# Patient Record
Sex: Female | Born: 1965
Health system: Southern US, Community
[De-identification: ages and names within clinical notes are randomized; demographics above are authoritative.]

## PROBLEM LIST (undated history)

## (undated) ENCOUNTER — Ambulatory Visit (HOSPITAL_BASED_OUTPATIENT_CLINIC_OR_DEPARTMENT_OTHER): Admission: EM | Payer: Self-pay | Source: Home / Self Care

## (undated) DIAGNOSIS — F419 Anxiety disorder, unspecified: Secondary | ICD-10-CM

## (undated) DIAGNOSIS — G43909 Migraine, unspecified, not intractable, without status migrainosus: Secondary | ICD-10-CM

## (undated) HISTORY — PX: CHOLECYSTECTOMY: SHX55

## (undated) HISTORY — DX: Anxiety disorder, unspecified: F41.9

## (undated) HISTORY — DX: Migraine, unspecified, not intractable, without status migrainosus: G43.909

---

## 2001-05-14 ENCOUNTER — Emergency Department (HOSPITAL_COMMUNITY): Admission: EM | Admit: 2001-05-14 | Discharge: 2001-05-14 | Payer: Self-pay | Admitting: Emergency Medicine

## 2011-12-25 ENCOUNTER — Encounter (HOSPITAL_BASED_OUTPATIENT_CLINIC_OR_DEPARTMENT_OTHER): Payer: Self-pay | Admitting: *Deleted

## 2011-12-25 ENCOUNTER — Emergency Department (HOSPITAL_BASED_OUTPATIENT_CLINIC_OR_DEPARTMENT_OTHER)
Admission: EM | Admit: 2011-12-25 | Discharge: 2011-12-25 | Disposition: A | Discharge status: Home / Self Care | Payer: 59 | Attending: Emergency Medicine | Admitting: Emergency Medicine

## 2011-12-25 DIAGNOSIS — B373 Candidiasis of vulva and vagina: Secondary | ICD-10-CM

## 2011-12-25 DIAGNOSIS — J069 Acute upper respiratory infection, unspecified: Secondary | ICD-10-CM | POA: Insufficient documentation

## 2011-12-25 DIAGNOSIS — J3489 Other specified disorders of nose and nasal sinuses: Secondary | ICD-10-CM | POA: Insufficient documentation

## 2011-12-25 DIAGNOSIS — B3731 Acute candidiasis of vulva and vagina: Secondary | ICD-10-CM | POA: Insufficient documentation

## 2011-12-25 MED ORDER — OXYMETAZOLINE HCL 0.05 % NA SOLN
1.0000 | Freq: Once | NASAL | Status: AC
Start: 2011-12-25 — End: 2011-12-25
  Administered 2011-12-25: 1 via NASAL
  Filled 2011-12-25: qty 15

## 2011-12-25 MED ORDER — FLUCONAZOLE 50 MG PO TABS
150.0000 mg | ORAL_TABLET | Freq: Once | ORAL | Status: AC
  Administered 2011-12-25: 150 mg via ORAL
  Filled 2011-12-25: qty 1

## 2011-12-25 NOTE — ED Provider Notes (Addendum)
History     CSN: [id number]  Arrival date & time 12/25/11  8295   First MD Initiated Contact with Patient 12/25/11 [phone]      Chief Complaint  Patient presents with  . Nasal Congestion   Patient presents with multiple complaints. She's been having nasal congestion for 4 weeks. She's apparently been prescribed numerous different antibiotics and states that not getting better. Also, states for years feels stuffy. She's been having a yeast infection from taking the antibiotics. She also feels some redness to the skin beneath her breasts. Patient also complains of constipation. She denies fevers, denies chest pain or difficulty breathing. She has had no sore throat, no dizziness. No malaise or syncope. (Consider location/radiation/quality/duration/timing/severity/associated sxs/prior treatment) HPI  History reviewed. No pertinent past medical history.  History reviewed. No pertinent past surgical history.  History reviewed. No pertinent family history.  History  Substance Use Topics  . Smoking status: Never Smoker   . Smokeless tobacco: Not on file  . Alcohol Use: No    OB History    Grav Para Term Preterm Abortions TAB SAB Ect Mult Living                  Review of Systems  All other systems reviewed and are negative.    Allergies  Erythromycin  Home Medications   Current Outpatient Rx  Name Route Sig Dispense Refill  . MOXIFLOXACIN HCL 400 MG PO TABS Oral Take 400 mg by mouth daily.      BP 133/65  Pulse 78  Temp(Src) 97.8 F (36.6 C) (Oral)  Resp 18  Ht 5\' 2"  (1.575 m)  Wt 150 lb (68.04 kg)  BMI 27.44 kg/m2  SpO2 99%  LMP 12/07/2011  Physical Exam  Nursing note and vitals reviewed. Constitutional: She appears well-developed and well-nourished. No distress.  HENT:  Head: Normocephalic.  Mouth/Throat: No oropharyngeal exudate.       Minimal nasal congestion, and rhinorrhea. Otherwise, normal  Eyes: Pupils are equal, round, and reactive to light.    Cardiovascular: Normal heart sounds.   Pulmonary/Chest: Breath sounds normal.  Abdominal: Soft.  Musculoskeletal: Normal range of motion.  Neurological: She is alert.  Skin: Skin is warm and dry.    ED Course  Procedures (including critical care time)  Labs Reviewed - No data to display No results found.   1. Viral URI   2. Vaginal yeast infection       MDM  Patient is seen and examined, initial history and physical is completed. Evaluation initiated      Patient will be given Afrin for nasal congestion. She was counseled regarding the importance of proper treatment of viral infections. She was given one dose of oral Diflucan and will be stable for discharge  Evony Rezek A. Patrica Duel, MD 12/25/11 0559  Lorelle Gibbs. Patrica Duel, MD 01/08/12 1103

## 2011-12-25 NOTE — ED Notes (Signed)
C/o nasal congestion x 4 weeks even after numerous antibiotics. Also c/o right ear pain

## 2011-12-25 NOTE — ED Notes (Signed)
MD at bedside. 

## 2014-03-30 ENCOUNTER — Encounter: Payer: Self-pay | Admitting: Internal Medicine

## 2014-03-30 ENCOUNTER — Ambulatory Visit (INDEPENDENT_AMBULATORY_CARE_PROVIDER_SITE_OTHER): Payer: 59 | Admitting: Internal Medicine

## 2014-03-30 VITALS — BP 118/66 | HR 97 | Temp 98.4°F | Resp 12 | Ht 62.0 in | Wt 139.0 lb

## 2014-03-30 DIAGNOSIS — E063 Autoimmune thyroiditis: Secondary | ICD-10-CM

## 2014-03-30 DIAGNOSIS — E039 Hypothyroidism, unspecified: Secondary | ICD-10-CM

## 2014-03-30 NOTE — Patient Instructions (Signed)
Please return in 6 months. Please stop at the lab.  Hypothyroidism The thyroid is a large gland located in the lower front of your neck. The thyroid gland helps control metabolism. Metabolism is how your body handles food. It controls metabolism with the hormone thyroxine. When this gland is underactive (hypothyroid), it produces too little hormone.  CAUSES These include:   Absence or destruction of thyroid tissue.  Goiter due to iodine deficiency.  Goiter due to medications.  Congenital defects (since birth).  Problems with the pituitary. This causes a lack of TSH (thyroid stimulating hormone). This hormone tells the thyroid to turn out more hormone. SYMPTOMS  Lethargy (feeling as though you have no energy)  Cold intolerance  Weight gain (in spite of normal food intake)  Dry skin  Coarse hair  Menstrual irregularity (if severe, may lead to infertility)  Slowing of thought processes Cardiac problems are also caused by insufficient amounts of thyroid hormone. Hypothyroidism in the newborn is cretinism, and is an extreme form. It is important that this form be treated adequately and immediately or it will lead rapidly to retarded physical and mental development. DIAGNOSIS  To prove hypothyroidism, your caregiver may do blood tests and ultrasound tests. Sometimes the signs are hidden. It may be necessary for your caregiver to watch this illness with blood tests either before or after diagnosis and treatment. TREATMENT  Low levels of thyroid hormone are increased by using synthetic thyroid hormone. This is a safe, effective treatment. It usually takes about four weeks to gain the full effects of the medication. After you have the full effect of the medication, it will generally take another four weeks for problems to leave. Your caregiver may start you on low doses. If you have had heart problems the dose may be gradually increased. It is generally not an emergency to get rapidly to  normal. HOME CARE INSTRUCTIONS   Take your medications as your caregiver suggests. Let your caregiver know of any medications you are taking or start taking. Your caregiver will help you with dosage schedules.  As your condition improves, your dosage needs may increase. It will be necessary to have continuing blood tests as suggested by your caregiver.  Report all suspected medication side effects to your caregiver. SEEK MEDICAL CARE IF: Seek medical care if you develop:  Sweating.  Tremulousness (tremors).  Anxiety.  Rapid weight loss.  Heat intolerance.  Emotional swings.  Diarrhea.  Weakness. SEEK IMMEDIATE MEDICAL CARE IF:  You develop chest pain, an irregular heart beat (palpitations), or a rapid heart beat. MAKE SURE YOU:   Understand these instructions.  Will watch your condition.  Will get help right away if you are not doing well or get worse. Document Released: 11/24/2005 Document Revised: 02/16/2012 Document Reviewed: 07/14/2008 [facility] Patient Information 2014 [place], [place].

## 2014-03-30 NOTE — Progress Notes (Signed)
Patient ID: Sheri Simpson, female   DOB: June 24, 1966, 48 y.o.   MRN: [id number]   HPI  Sheri Simpson is a 48 y.o.-year-old female, self-referred for evaluation for hypothyroidism.  Pt. has been dx with hypothyroidism in 2012 is on Levothyroxine >> switched to Synthroid brand name 112 mcg >> gained weight 20 lbs over 2 years >> stopped for 7 months >> lost 15 lbs >> TSH was checked and was high, but she still did not start the LT4:  She was taking the Synthroid: - fasting - with water - separated by >30 min from b'fast  - along with Lexapro - no calcium, iron, PPIs, multivitamins   I reviewed pt's thyroid tests: Summer 2014: TSH 13 (off Synthroid for 7 months) - per pt's report. During the appt, we received labs from PCP office: 03/2014: TSH 5.539 (0.35-4.5); TT4 5.2  She was taking Lexapro >> now on Wellbutrin.  Pt denies feeling nodules in neck, hoarseness, dysphagia/odynophagia, SOB with lying down.  Pt describes: - + cold intolerance - + weight loss (see above) - fatigue - + constipation (always) - + dry skin on L foot (psoriasis) - no hair falling - no depression, + anxiety  She has + FH of thyroid disorders in: PGM. No FH of thyroid cancer.  No h/o radiation tx to head or neck.  No recent use of iodine supplements. No seaweed or kelp.  I reviewed her chart and she also has a history of anxiety, cholecystectomy 2013.  ROS: Constitutional: both weight gain/loss, no fatigue, no subjective hyperthermia/hypothermia Eyes: no blurry vision, no xerophthalmia ENT: no sore throat, no nodules palpated in throat, no dysphagia/odynophagia, no hoarseness Cardiovascular: no CP/SOB/palpitations/leg swelling Respiratory: no cough/SOB Gastrointestinal: + N/noV/D/+C Musculoskeletal: no muscle/joint aches Skin: + rash - L dorsum foot (psoriasis) Neurological: no tremors/numbness/tingling/dizziness Psychiatric: no depression/+ anxiety  Past Medical History  Diagnosis Date  .  Migraines   . Anxiety    Past Surgical History  Procedure Laterality Date  . Cholecystectomy    . Cesarean section     History   Social History  . Marital Status: Widowed    Spouse Name: N/A    Number of Children: 2   Occupational History  . Courier for [employer]   Social History Main Topics  . Smoking status: Never Smoker   . Smokeless tobacco: Not on file  . Alcohol Use: 2x a month,1 drink, wine  . Drug Use: No   Current Outpatient Prescriptions on File Prior to Visit  Medication Sig Dispense Refill  . moxifloxacin (AVELOX) 400 MG tablet Take 400 mg by mouth daily.       No current facility-administered medications on file prior to visit.   Allergies  Allergen Reactions  . Erythromycin    Family History  Problem Relation Age of Onset  . Stroke Mother   . Hypertension Mother   . Thyroid disease Paternal Grandmother    PE: BP 118/66  Pulse 97  Temp(Src) 98.4 F (36.9 C) (Oral)  Resp 12 There is no weight on file to calculate BMI.  Wt Readings from Last 3 Encounters:  12/25/11 150 lb (68.04 kg)   Constitutional: overweight, in NAD Eyes: PERRLA, EOMI, no exophthalmos ENT: moist mucous membranes, no thyromegaly, no cervical lymphadenopathy Cardiovascular: RRR, No MRG Respiratory: CTA B Gastrointestinal: abdomen soft, NT, ND, BS+ Musculoskeletal: no deformities, strength intact in all 4 Skin: moist, warm, no rashes Neurological: fine tremor with outstretched hands, DTR normal in all 4  ASSESSMENT: 1. Hypothyroidism  PLAN:  1. Patient with 3 year h/o hypothyroidism, noncompliant with levothyroxine therapy as she believes this caused her to gain weight. She lost weight after stopping it and is off the LT4 for ~ 1.5 years. She appears euthyroid. - She does not appear to have a goiter, thyroid nodules, or neck compression symptoms - we received labs from PCP during the appt >> TSH a little above the ULN, at 5.5. She is very happy with these news! As she really  does not want to tae the LT4 if she can help it. - will recheck thyroid tests today: TSH, free T4, free T3 and will add TPO ABs. - If these are abnormal, she will need to return in 6-8 weeks for repeat labs - If these are normal, I will see her back in 6 months. In this case, she could have had an episode of thyroiditis at the time of dx, which resolved since.   Office Visit on 03/30/2014  Component Date Value Ref Range Status  . TSH 03/30/2014 6.863* 0.350 - 4.500 uIU/mL Final  . Free T4 03/30/2014 0.77* 0.80 - 1.80 ng/dL Final  . T3, Free 16/10/960404/23/2015 2.6  2.3 - 4.2 pg/mL Final  . Thyroid Peroxidase Antibody 03/30/2014 139.0* <35.0 IU/mL Final   Comment:                            The thyroid microsomal antigen has been shown to be Thyroid                          Peroxidase (TPO).  This assay detects anti-TPO antibodies.   Pt with TPO Abs, a higher TSH and a lower free T4 >> new Dx of Hashimoto's thyroiditis >> I would suggest a low dose of Synthroid, at least 25 mcg daily. Alternatively, we can recheck tests at next visit, but this would not an ideal plan of action.  We started Synthroid 25.

## 2014-03-31 ENCOUNTER — Other Ambulatory Visit: Payer: Self-pay | Admitting: *Deleted

## 2014-03-31 DIAGNOSIS — E063 Autoimmune thyroiditis: Secondary | ICD-10-CM | POA: Insufficient documentation

## 2014-03-31 LAB — T3, FREE: T3, Free: 2.6 pg/mL (ref 2.3–4.2)

## 2014-03-31 LAB — THYROID PEROXIDASE ANTIBODY: Thyroperoxidase Ab SerPl-aCnc: 139 IU/mL — ABNORMAL HIGH (ref <35.0)

## 2014-03-31 LAB — T4, FREE: FREE T4: 0.77 ng/dL — AB (ref 0.80–1.80)

## 2014-03-31 LAB — TSH: TSH: 6.863 u[IU]/mL — AB (ref 0.350–4.500)

## 2014-04-03 MED ORDER — LEVOTHYROXINE SODIUM 25 MCG PO TABS: 25.0000 ug | ORAL_TABLET | Freq: Every day | ORAL | Status: AC

## 2014-09-29 ENCOUNTER — Telehealth: Payer: Self-pay | Admitting: Internal Medicine

## 2014-09-29 ENCOUNTER — Ambulatory Visit: Payer: 59 | Admitting: Internal Medicine

## 2014-09-29 DIAGNOSIS — Z0289 Encounter for other administrative examinations: Secondary | ICD-10-CM

## 2014-09-29 NOTE — Telephone Encounter (Signed)
Patient no showed today's appt. Please advise on how to follow up. °A. No follow up necessary. °B. Follow up urgent. Contact patient immediately. °C. Follow up necessary. Contact patient and schedule visit in ___ days. °D. Follow up advised. Contact patient and schedule visit in ____weeks. ° °

## 2014-09-29 NOTE — Telephone Encounter (Signed)
Up to 3 mo

## 2019-10-06 ENCOUNTER — Emergency Department (HOSPITAL_BASED_OUTPATIENT_CLINIC_OR_DEPARTMENT_OTHER)
Admission: EM | Admit: 2019-10-06 | Discharge: 2019-10-06 | Disposition: A | Discharge status: Home / Self Care | Payer: Commercial Managed Care - PPO | Attending: Emergency Medicine | Admitting: Emergency Medicine

## 2019-10-06 ENCOUNTER — Other Ambulatory Visit: Payer: Self-pay

## 2019-10-06 ENCOUNTER — Emergency Department (HOSPITAL_BASED_OUTPATIENT_CLINIC_OR_DEPARTMENT_OTHER): Payer: Commercial Managed Care - PPO

## 2019-10-06 ENCOUNTER — Encounter (HOSPITAL_BASED_OUTPATIENT_CLINIC_OR_DEPARTMENT_OTHER): Payer: Self-pay | Admitting: *Deleted

## 2019-10-06 DIAGNOSIS — M25532 Pain in left wrist: Secondary | ICD-10-CM | POA: Insufficient documentation

## 2019-10-06 DIAGNOSIS — Y999 Unspecified external cause status: Secondary | ICD-10-CM | POA: Insufficient documentation

## 2019-10-06 DIAGNOSIS — W109XXA Fall (on) (from) unspecified stairs and steps, initial encounter: Secondary | ICD-10-CM | POA: Insufficient documentation

## 2019-10-06 DIAGNOSIS — Y9301 Activity, walking, marching and hiking: Secondary | ICD-10-CM | POA: Insufficient documentation

## 2019-10-06 DIAGNOSIS — Z79899 Other long term (current) drug therapy: Secondary | ICD-10-CM | POA: Diagnosis not present

## 2019-10-06 DIAGNOSIS — M545 Low back pain: Secondary | ICD-10-CM | POA: Diagnosis not present

## 2019-10-06 DIAGNOSIS — Y929 Unspecified place or not applicable: Secondary | ICD-10-CM | POA: Diagnosis not present

## 2019-10-06 DIAGNOSIS — M25562 Pain in left knee: Secondary | ICD-10-CM | POA: Insufficient documentation

## 2019-10-06 DIAGNOSIS — W19XXXA Unspecified fall, initial encounter: Secondary | ICD-10-CM

## 2019-10-06 MED ORDER — ONDANSETRON 4 MG PO TBDP: 4.0000 mg | ORAL_TABLET | Freq: Once | ORAL | Status: DC

## 2019-10-06 MED ORDER — HYDROCODONE-ACETAMINOPHEN 5-325 MG PO TABS
2.0000 | ORAL_TABLET | ORAL | 0 refills | Status: AC | PRN
Start: 2019-10-06 — End: ?

## 2019-10-06 MED ORDER — HYDROCODONE-ACETAMINOPHEN 5-325 MG PO TABS: 1.0000 | ORAL_TABLET | Freq: Once | ORAL | Status: DC

## 2019-10-06 MED FILL — HYDROCODON-APAP 5-325: 5-325 | 2 days supply | Qty: 10 | Fill #0

## 2019-10-06 NOTE — ED Triage Notes (Signed)
She slipped and fell outside on wet steps. Injury to her left wrist and left knee.

## 2019-10-06 NOTE — Discharge Instructions (Addendum)
Please call to schedule an appointment with Dr. Jeannie Fend regarding your wrist fractures.  Recommend that you take MiraLAX to prevent straining and subsequently cause tailbone discomfort.   Return to ED or seek medical attention immediately should develop any new worsening pain, significant swelling information, warmth or evidence of infection, numbness or tingling, or other neurologic findings.

## 2019-10-06 NOTE — ED Provider Notes (Signed)
[facility] EMERGENCY DEPARTMENT Provider Note   CSN: [id number] Arrival date & time: 10/06/19  1245     History   Chief Complaint Chief Complaint  Patient presents with   Fall    HPI Sheri Simpson is a 53 y.o. female with past medical history significant for migraines who presents to the ED after sustaining mechanical fall down a set of steps this morning.  Patient reports that her power went out and she heard a loud "bang" so she ran outside to investigate and slipped on the wet steps falling down onto her buttocks and sliding down.  She reports 8 out of 10 left wrist pain as well as 6 out of 10 buttock and left knee discomfort.  She denies any head trauma or loss of consciousness.  She also denies any fever chills, incontinence, visual changes, or other focal deficits.  She is adamant that this is a mechanical fall and that she did not faint or pass out.     HPI  Past Medical History:  Diagnosis Date   Anxiety    Migraines     Patient Active Problem List   Diagnosis Date Noted   Hashimoto's thyroiditis 03/31/2014    Past Surgical History:  Procedure Laterality Date   CESAREAN SECTION     CHOLECYSTECTOMY       OB History   No obstetric history on file.      Home Medications    Prior to Admission medications   Medication Sig Start Date End Date Taking? Authorizing Provider  acyclovir (ZOVIRAX) 200 MG capsule TAKE 1 CAPSULE(200 MG) BY MOUTH TWICE DAILY 05/19/18  Yes [provider]  butalbital-acetaminophen-caffeine (FIORICET) 50-325-40 MG tablet Take by mouth. 12/02/18  Yes [provider]  ACETAMINOPHEN-BUTALBITAL 50-325 MG TABS Take 1 tablet by mouth as needed.    [provider]  buPROPion (WELLBUTRIN SR) 150 MG 12 hr tablet Take 150 mg by mouth 2 (two) times daily.    [provider]  HYDROcodone-acetaminophen (NORCO/VICODIN) 5-325 MG tablet Take 2 tablets by mouth every 4 (four) hours as needed.  10/06/19   Lorelee New, PA-C  levothyroxine (SYNTHROID, LEVOTHROID) 25 MCG tablet Take 1 tablet (25 mcg total) by mouth daily before breakfast.    Carlus Pavlov, MD  moxifloxacin (AVELOX) 400 MG tablet Take 400 mg by mouth daily.    [provider]  promethazine (PHENERGAN) 25 MG tablet Take 25 mg by mouth as needed for nausea or vomiting.    [provider]    Family History Family History  Problem Relation Age of Onset   Stroke Mother    Hypertension Mother    Thyroid disease Paternal Grandmother     Social History Social History   Tobacco Use   Smoking status: Never Smoker   Smokeless tobacco: Never Used  Substance Use Topics   Alcohol use: No   Drug use: No     Allergies   Erythromycin   Review of Systems Review of Systems  All other systems reviewed and are negative.    Physical Exam Updated Vital Signs BP 123/69 (BP Location: Right Arm)    Pulse 86    Temp 98.1 F (36.7 C) (Oral)    Resp 14    Ht 5\' 2"  (1.575 m)    Wt 62.1 kg    SpO2 100%    BMI 25.06 kg/m   Physical Exam Vitals signs and nursing note reviewed. Exam conducted with a chaperone present.  Constitutional:  Appearance: Normal appearance.  HENT:     Head: Normocephalic and atraumatic.  Eyes:     General: No scleral icterus.    Extraocular Movements: Extraocular movements intact.     Conjunctiva/sclera: Conjunctivae normal.     Pupils: Pupils are equal, round, and reactive to light.  Neck:     Musculoskeletal: Normal range of motion. No neck rigidity.  Cardiovascular:     Rate and Rhythm: Normal rate and regular rhythm.     Pulses: Normal pulses.     Heart sounds: Normal heart sounds.  Pulmonary:     Effort: Pulmonary effort is normal. No respiratory distress.     Breath sounds: Normal breath sounds.  Musculoskeletal:     Comments: Left wrist: Significant tenderness to palpation on the ulnar and radial aspect.  Distal capillary refill intact.   Sensation intact over median, ulnar, and radial nerves.  Patient can flex and extend fingers.  Range of motion intact at wrist, but limited by pain and inflammation. Left knee: Patient is able to exhibit full range of motion, strength, and has pulses and sensation intact distally. Coccyx: Tenderness palpation.  Sensation intact.    Skin:    General: Skin is dry.  Neurological:     Mental Status: She is alert.     GCS: GCS eye subscore is 4. GCS verbal subscore is 5. GCS motor subscore is 6.  Psychiatric:        Mood and Affect: Mood normal.        Behavior: Behavior normal.        Thought Content: Thought content normal.      ED Treatments / Results  Labs (all labs ordered are listed, but only abnormal results are displayed) Labs Reviewed - No data to display  EKG None  Radiology Dg Sacrum/coccyx  Result Date: 10/06/2019 CLINICAL DATA:  Sacrum and coccyx pain secondary to a fall down steps today. EXAM: SACRUM AND COCCYX - 2+ VIEW COMPARISON:  None. FINDINGS: There is slight irregularity of the distal segment of the coccyx which may represent a fracture. The sacrum is intact. Alignment of the coccygeal segments is normal. IMPRESSION: Possible fracture of the distal segment of the coccyx. Normal sacrum. Electronically Signed   By: Francene BoyersJames  Maxwell M.D.   On: 10/06/2019 14:11   Dg Wrist Complete Left  Result Date: 10/06/2019 CLINICAL DATA:  Pain following fall EXAM: LEFT WRIST - COMPLETE 3+ VIEW COMPARISON:  None. FINDINGS: Frontal, oblique, lateral, and ulnar deviation scaphoid images were obtained. There is a transversely oriented fracture of the distal radial metaphysis with mild impaction at the fracture site. There is avulsion of the ulnar styloid. No other fractures are evident. No dislocation. Joint spaces appear normal. No erosive change. IMPRESSION: Transversely oriented fracture distal radial metaphysis with impaction at the fracture site. Avulsion ulnar styloid. No other  fractures are evident. No dislocation. No appreciable arthropathy. Electronically Signed   By: Bretta BangWilliam  Woodruff III M.D.   On: 10/06/2019 14:03   Dg Knee Complete 4 Views Left  Result Date: 10/06/2019 CLINICAL DATA:  Pain fall EXAM: LEFT KNEE - COMPLETE 4+ VIEW COMPARISON:  None. FINDINGS: Frontal, lateral, and bilateral oblique views were obtained. There is no well-defined fracture evident. There is subtle sclerosis along the medial tibial plateau which may indicate acute impaction type injury. There is no dislocation. No joint effusion. There is slight narrowing medially. Other joint spaces appear normal. No erosive change. IMPRESSION: Sclerosis in the medial tibial plateau region which may indicate  recent and possibly acute impaction type injury. No well-defined fracture evident. This finding may warrant MR to assess for marrow edema in the medial tibial plateau region. No dislocation.  No joint effusion.  Slight narrowing medially. Electronically Signed   By: Lowella Grip III M.D.   On: 10/06/2019 14:05    Procedures Procedures (including critical care time)  Medications Ordered in ED Medications  ondansetron (ZOFRAN-ODT) disintegrating tablet 4 mg (4 mg Oral Refused 10/06/19 1504)     Initial Impression / Assessment and Plan / ED Course  I have reviewed the triage vital signs and the nursing notes.  Pertinent labs & imaging results that were available during my care of the patient were reviewed by me and considered in my medical decision making (see chart for details).        Reviewed plain films obtained from her wrist, coccyx, and left knee.  Patient has a left arm transverse radial metaphysis fracture and ulnar styloid avulsion fracture.  She also has what appears to be a coccyx fracture, distally.  Her left knee plain films were interpreted and do not demonstrate any clear acute bony normalities and her clinical assessment is inconsistent with a fracture at this time.  She is  able to bear weight fully and her gait is not impeded.  She states that that is the least of her pain and discomfort and is suggesting that it is her distal hamstring that is affected.  Spoke with Silvestre Gunner, PA and will place patient in a sugar tong splint.  She will call the office to see Dr. Jeannie Fend tomorrow for further evaluation of her wrist fractures.  Regarding her knee discomfort, if it worsens and she is no longer able to ambulate or bear weight, she will be able to speak with other orthopedists at Dr[facility] clinic.  Regarding her coccyx fracture, there is not much that can be done.  Will recommend that she take MiraLAX to avoid straining.  She states that she takes a milk of magnesia that works adequately and she will continue to use that as needed.  Patient is entirely neurovascularly intact and does not have any pain out of proportion with physical exam.  I am not concerned for compartment syndrome or any tendon or nerve damage at this time.  Her distal pulses and cap refill are all intact.  Return to ED or seek medical attention immediately should develop any new worsening pain, significant swelling information, warmth or evidence of infection, numbness or tingling, or other neurologic findings.  Final Clinical Impressions(s) / ED Diagnoses   Final diagnoses:  Fall    ED Discharge Orders         Ordered    HYDROcodone-acetaminophen (NORCO/VICODIN) 5-325 MG tablet  Every 4 hours PRN     10/06/19 1534           Corena Herter, PA-C 10/06/19 [place], Toco, DO 10/07/19 [phone]

## 2021-05-04 IMAGING — DX DG SACRUM/COCCYX 2+V
3 series · 3 of 3 positions shown · non-contrast
Comparison: None.

CLINICAL DATA: Sacrum and coccyx pain secondary to a fall down
steps today.

EXAM:
SACRUM AND COCCYX - 2+ VIEW

[coccyx ap]
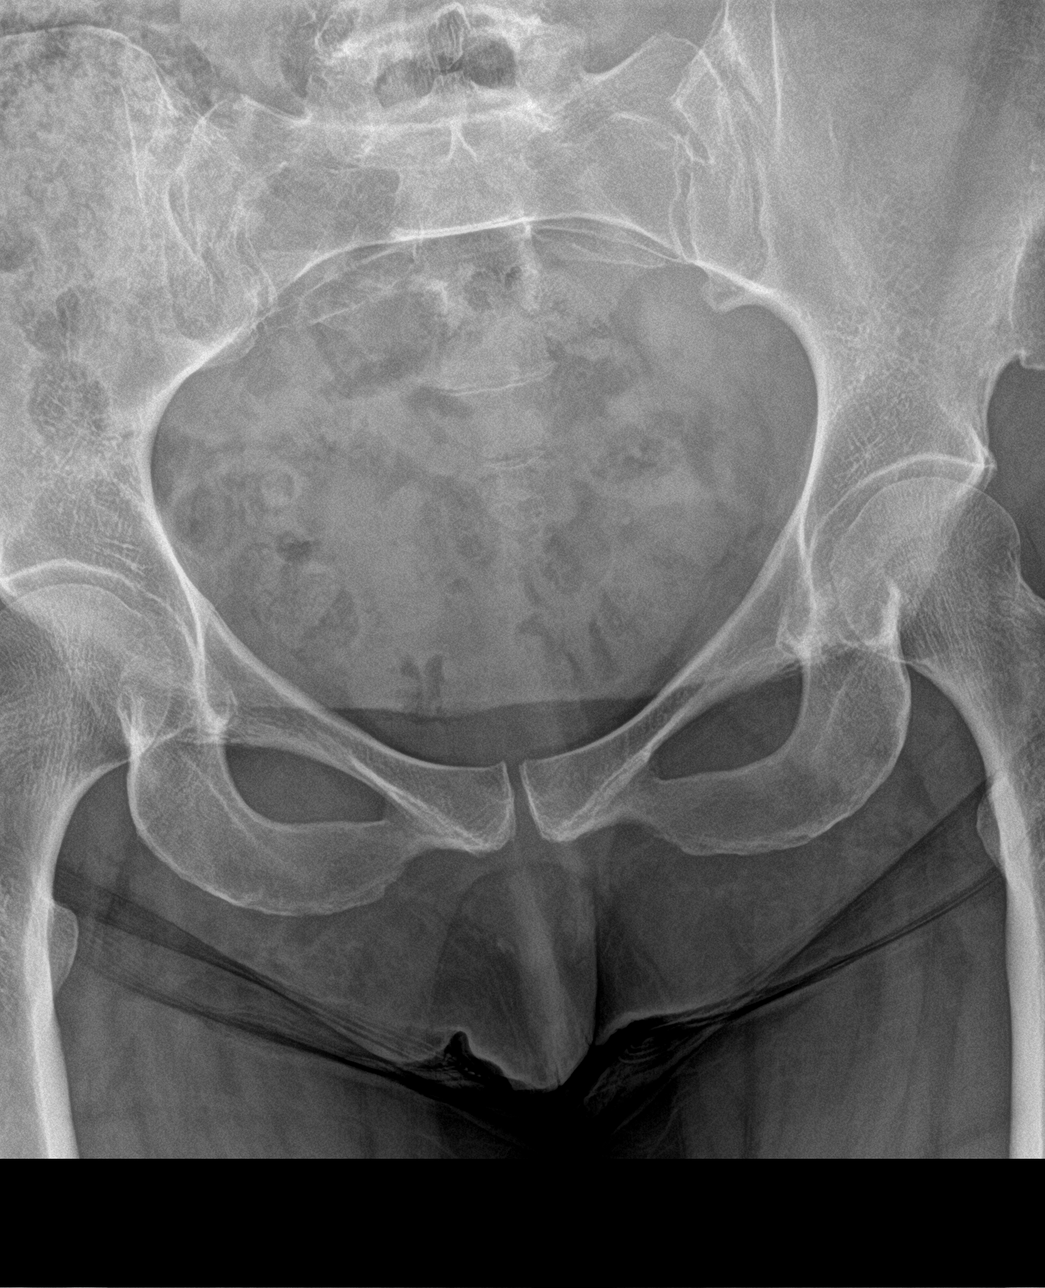

[sacrum ap]
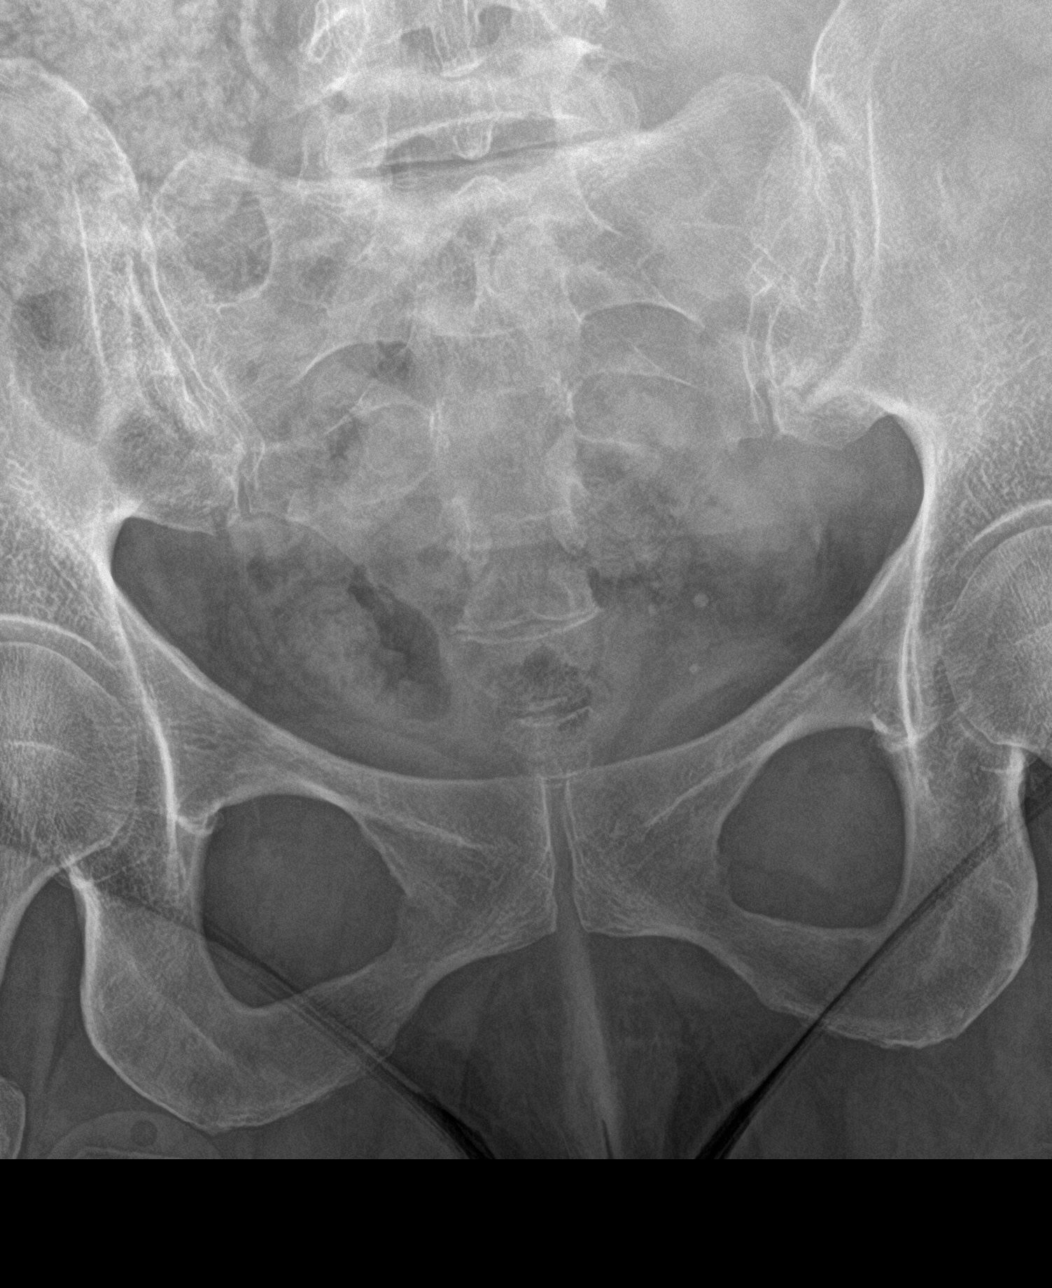

[sacrum lat]
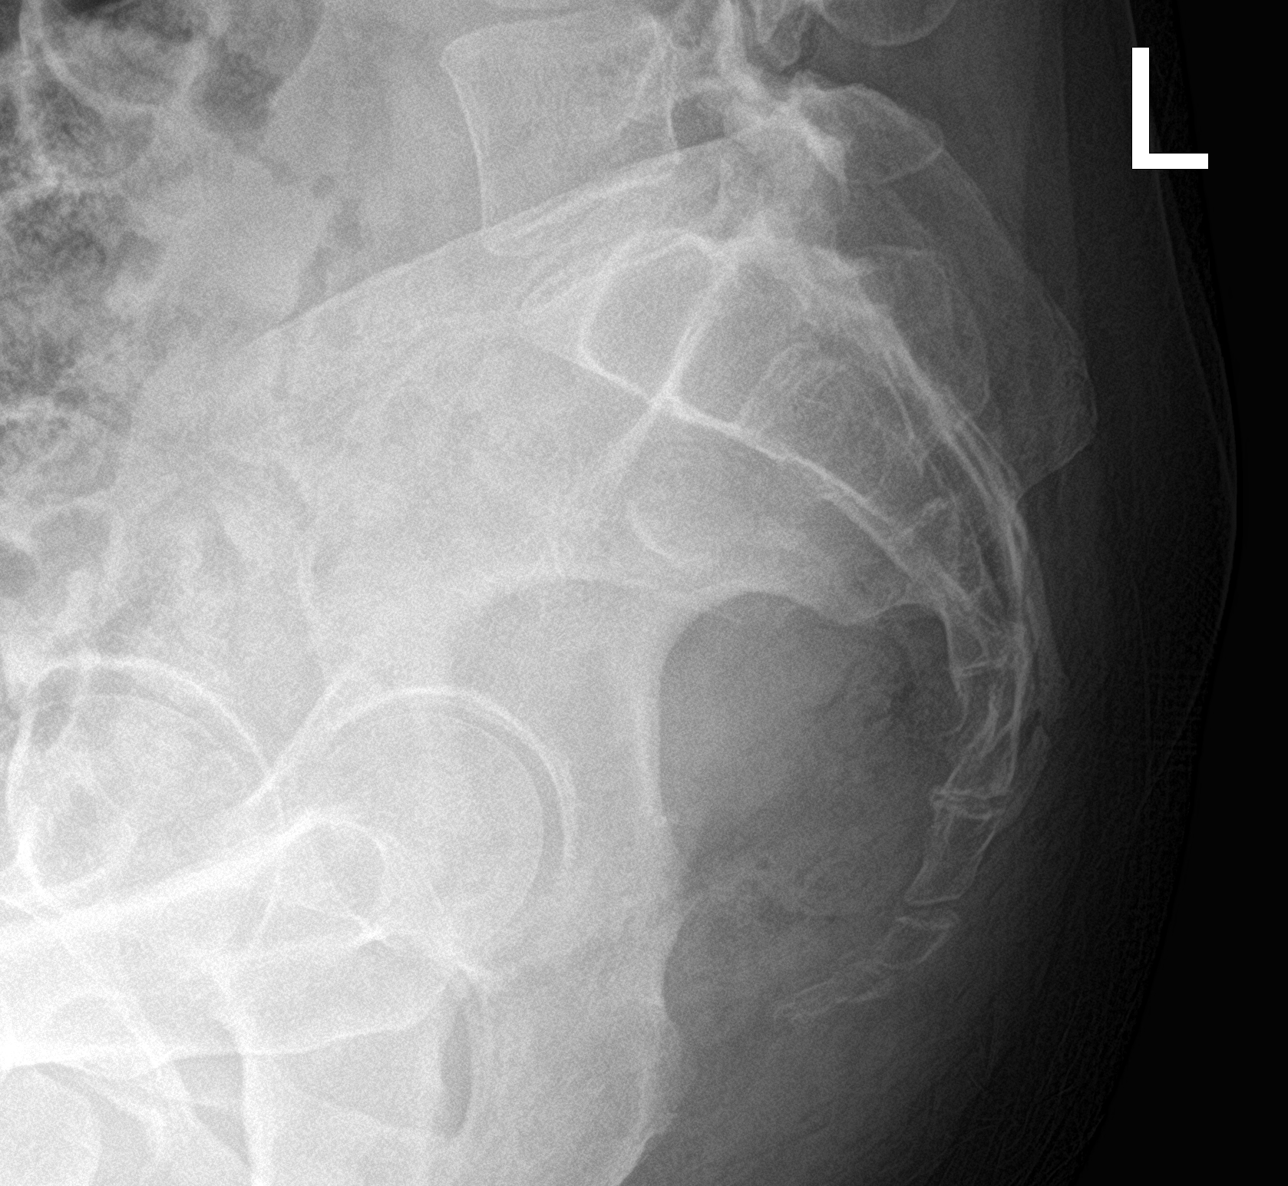

[3 of 3 positions shown; findings below may reference images not displayed]

FINDINGS: There is slight irregularity of the distal segment of the coccyx
which may represent a fracture. The sacrum is intact. Alignment of
the coccygeal segments is normal.
IMPRESSION: Possible fracture of the distal segment of the coccyx. Normal
sacrum.

## 2021-07-05 ENCOUNTER — Encounter (HOSPITAL_BASED_OUTPATIENT_CLINIC_OR_DEPARTMENT_OTHER): Payer: Self-pay | Admitting: *Deleted

## 2021-07-05 ENCOUNTER — Emergency Department (HOSPITAL_BASED_OUTPATIENT_CLINIC_OR_DEPARTMENT_OTHER)
Admission: EM | Admit: 2021-07-05 | Discharge: 2021-07-05 | Disposition: A | Discharge status: Home / Self Care | Payer: BLUE CROSS/BLUE SHIELD | Attending: Emergency Medicine | Admitting: Emergency Medicine

## 2021-07-05 ENCOUNTER — Other Ambulatory Visit: Payer: Self-pay

## 2021-07-05 DIAGNOSIS — Z20822 Contact with and (suspected) exposure to covid-19: Secondary | ICD-10-CM | POA: Diagnosis not present

## 2021-07-05 DIAGNOSIS — Z79899 Other long term (current) drug therapy: Secondary | ICD-10-CM | POA: Diagnosis not present

## 2021-07-05 DIAGNOSIS — J029 Acute pharyngitis, unspecified: Secondary | ICD-10-CM

## 2021-07-05 DIAGNOSIS — R070 Pain in throat: Secondary | ICD-10-CM | POA: Diagnosis present

## 2021-07-05 LAB — GROUP A STREP BY PCR: Group A Strep by PCR: NOT DETECTED

## 2021-07-05 LAB — RESP PANEL BY RT-PCR (FLU A&B, COVID) ARPGX2
Influenza A by PCR: NEGATIVE
Influenza B by PCR: NEGATIVE
SARS Coronavirus 2 by RT PCR: NEGATIVE

## 2021-07-05 NOTE — ED Provider Notes (Signed)
[facility] EMERGENCY DEPARTMENT Provider Note   CSN: [id number] Arrival date & time: 07/05/21  1341     History Chief Complaint  Patient presents with   Sore Throat    Sheri Simpson is a 55 y.o. female with pmh of hypothyroidism presenting with voice hoarsness and sore throat. States that about a week ago she had some pain in her teeth that radiated to her ear. This resolved shortly after, but she continues with a sore throat aggravated by swallowing. Reports that she "feels like something is stuck in the bottom of her neck." Sore throat made worse while eating carrots last night. Denies food allergies, CP or SOB. Expresses concern surrounding her thyroid since she stopped taking her levothyroxine many years ago and is unsure of her thyroid function. Does not smoke, one cat at home.   Sore Throat Pertinent negatives include no chest pain and no shortness of breath.      Past Medical History:  Diagnosis Date   Anxiety    Migraines     Patient Active Problem List   Diagnosis Date Noted   Hashimoto's thyroiditis 03/31/2014    Past Surgical History:  Procedure Laterality Date   CESAREAN SECTION     CHOLECYSTECTOMY       OB History   No obstetric history on file.     Family History  Problem Relation Age of Onset   Stroke Mother    Hypertension Mother    Thyroid disease Paternal Grandmother     Social History   Tobacco Use   Smoking status: Never   Smokeless tobacco: Never  Substance Use Topics   Alcohol use: No   Drug use: No    Home Medications Prior to Admission medications   Medication Sig Start Date End Date Taking? Authorizing Provider  ACETAMINOPHEN-BUTALBITAL 50-325 MG TABS Take 1 tablet by mouth as needed.    [provider]  acyclovir (ZOVIRAX) 200 MG capsule TAKE 1 CAPSULE(200 MG) BY MOUTH TWICE DAILY 05/19/18   [provider]  buPROPion (WELLBUTRIN SR) 150 MG 12 hr tablet Take 150 mg by mouth 2 (two) times  daily.    [provider]  butalbital-acetaminophen-caffeine (FIORICET) [phone] MG tablet Take by mouth. 12/02/18   [provider]  HYDROcodone-acetaminophen (NORCO/VICODIN) 5-325 MG tablet Take 2 tablets by mouth every 4 (four) hours as needed. 10/06/19   Lorelee New, PA-C  levothyroxine (SYNTHROID, LEVOTHROID) 25 MCG tablet Take 1 tablet (25 mcg total) by mouth daily before breakfast.    Carlus Pavlov, MD  moxifloxacin (AVELOX) 400 MG tablet Take 400 mg by mouth daily.    [provider]  promethazine (PHENERGAN) 25 MG tablet Take 25 mg by mouth as needed for nausea or vomiting.    [provider]    Allergies    Levothyroxine, Erythromycin, Prednisone, and Acetaminophen  Review of Systems   Review of Systems  Constitutional:  Negative for appetite change, chills and fever.  HENT:  Positive for sore throat, trouble swallowing and voice change. Negative for congestion, ear discharge, ear pain and sinus pressure.   Respiratory:  Negative for cough, choking, chest tightness and shortness of breath.   Cardiovascular:  Negative for chest pain.  Gastrointestinal:  Negative for constipation, diarrhea and nausea.  Allergic/Immunologic: Negative for food allergies.   Physical Exam Updated Vital Signs BP (!) 152/100   Pulse 95   Temp 98.3 F (36.8 C) (Oral)   Resp 16   Ht 5\' 2"  (1.575  m)   Wt 64.4 kg   SpO2 98%   BMI 25.97 kg/m   Physical Exam HENT:     Head: Normocephalic and atraumatic.     Right Ear: Tympanic membrane normal.     Left Ear: Tympanic membrane normal.     Nose: No congestion or rhinorrhea.     Mouth/Throat:     Mouth: Mucous membranes are moist. No oral lesions.     Pharynx: Uvula midline. Posterior oropharyngeal erythema (No evidence of PTA) present.  Neck:     Thyroid: No thyromegaly.  Cardiovascular:     Rate and Rhythm: Normal rate and regular rhythm.     Heart sounds: No murmur heard. Pulmonary:     Effort:  Pulmonary effort is normal.  Lymphadenopathy:     Cervical: No cervical adenopathy.  Skin:    General: Skin is warm and dry.  Neurological:     Mental Status: She is alert.    ED Results / Procedures / Treatments   Labs (all labs ordered are listed, but only abnormal results are displayed) Labs Reviewed  RESP PANEL BY RT-PCR (FLU A&B, COVID) ARPGX2  GROUP A STREP BY PCR    ED Course  I have reviewed the triage vital signs and the nursing notes.  Pertinent labs & imaging results that were available during my care of the patient were reviewed by me and considered in my medical decision making (see chart for details).  3:50-patient and I discussed viral illnesses in detail. I explained to patient why we cannot do her thyroid work-up in the ED.  4:05-patient notified about negative covid and strep. Agreeable to dc.   MDM Rules/Calculators/A&P                          PE more consistent with an acute viral infection than conditions such as PTA and retropharyngeal abscess. Patient negative for strep/covid.   Final Clinical Impression(s) / ED Diagnoses Final diagnoses:  Acute pharyngitis, unspecified etiology    Rx / DC Orders Symptomatic treatment discussed at length. Expected resolution in 7-10 days. Will search for pcp for thyroid follow-up    Saddie Benders, PA-C 07/05/21 1709    Virgina Norfolk, DO 07/05/21 1729

## 2021-07-05 NOTE — ED Triage Notes (Signed)
Sore throat with hoarseness x 3 days 

## 2021-12-15 ENCOUNTER — Emergency Department (HOSPITAL_BASED_OUTPATIENT_CLINIC_OR_DEPARTMENT_OTHER)
Admission: EM | Admit: 2021-12-15 | Discharge: 2021-12-15 | Disposition: A | Discharge status: Home / Self Care | Payer: BLUE CROSS/BLUE SHIELD | Attending: Emergency Medicine | Admitting: Emergency Medicine

## 2021-12-15 ENCOUNTER — Encounter (HOSPITAL_BASED_OUTPATIENT_CLINIC_OR_DEPARTMENT_OTHER): Payer: Self-pay | Admitting: Emergency Medicine

## 2021-12-15 ENCOUNTER — Other Ambulatory Visit: Payer: Self-pay

## 2021-12-15 DIAGNOSIS — H9203 Otalgia, bilateral: Secondary | ICD-10-CM | POA: Insufficient documentation

## 2021-12-15 DIAGNOSIS — U071 COVID-19: Secondary | ICD-10-CM | POA: Insufficient documentation

## 2021-12-15 LAB — RESP PANEL BY RT-PCR (FLU A&B, COVID) ARPGX2
Influenza A by PCR: NEGATIVE
Influenza B by PCR: NEGATIVE
SARS Coronavirus 2 by RT PCR: POSITIVE — AB

## 2021-12-15 NOTE — ED Triage Notes (Signed)
Pt reports fever 100.0; feels "lousy"; head feels full, but not congested

## 2021-12-15 NOTE — Discharge Instructions (Addendum)
It was a pleasure taking care of you today!   Your swab today was positive for COVID.  Per the CDC guidelines you will need to self quarantine and self isolate for 5 days.  Your quarantine period will end on Wednesday, 12/18/2021.  Ensure that you are wearing a mask.  You may continue taking over-the-counter medications as directed for your symptoms.  You may follow-up with your primary care provider as needed.  Return to the ED if you are experiencing trouble breathing, chest pain, decreased fluid intake, or worsening symptoms.

## 2021-12-15 NOTE — ED Provider Notes (Signed)
[facility] EMERGENCY DEPARTMENT Provider Note   CSN: [id number] Arrival date & time: 12/15/21  1451     History Chief Complaint  Patient presents with   Fever    Sheri Simpson is a 56 y.o. female with no significant past medical history who presents to the ED complaining of fever (max temp 100) onset today.  She has associated sore throat x2 days, productive cough times green sputum x3 days, postnasal drip, facial pain, bilateral ear pain.  She has tried Alka-Seltzer with no relief for her symptoms.  Denies chest pain, shortness of breath, abdominal pain, nausea, vomiting, trouble swallowing, chills.  Denies sick contacts.    Past Medical History:  Diagnosis Date   Anxiety    Migraines    The history is provided by the patient. No language interpreter was used.      Home Medications Prior to Admission medications   Medication Sig Start Date End Date Taking? Authorizing Provider  ACETAMINOPHEN-BUTALBITAL 50-325 MG TABS Take 1 tablet by mouth as needed.    [provider]  acyclovir (ZOVIRAX) 200 MG capsule TAKE 1 CAPSULE(200 MG) BY MOUTH TWICE DAILY 05/19/18   [provider]  buPROPion (WELLBUTRIN SR) 150 MG 12 hr tablet Take 150 mg by mouth 2 (two) times daily.    [provider]  butalbital-acetaminophen-caffeine (FIORICET) [phone] MG tablet Take by mouth. 12/02/18   [provider]  HYDROcodone-acetaminophen (NORCO/VICODIN) 5-325 MG tablet Take 2 tablets by mouth every 4 (four) hours as needed. 10/06/19   Lorelee New, PA-C  levothyroxine (SYNTHROID, LEVOTHROID) 25 MCG tablet Take 1 tablet (25 mcg total) by mouth daily before breakfast.    Carlus Pavlov, MD  moxifloxacin (AVELOX) 400 MG tablet Take 400 mg by mouth daily.    [provider]  promethazine (PHENERGAN) 25 MG tablet Take 25 mg by mouth as needed for nausea or vomiting.    [provider]      Allergies    Levothyroxine, Erythromycin,  Prednisone, and Acetaminophen    Review of Systems   Review of Systems  Constitutional:  Positive for fever. Negative for chills.  HENT:  Positive for ear pain, postnasal drip, sinus pain and sore throat. Negative for congestion, rhinorrhea and trouble swallowing.   Respiratory:  Negative for shortness of breath.   Cardiovascular:  Negative for chest pain.  All other systems reviewed and are negative.  Physical Exam Updated Vital Signs BP 128/74    Pulse 96    Temp 99.8 F (37.7 C) (Oral)    Resp 20    Ht 5\' 2"  (1.575 m)    Wt 63.5 kg    SpO2 100%    BMI 25.61 kg/m  Physical Exam Vitals and nursing note reviewed.  Constitutional:      General: She is not in acute distress.    Appearance: She is not diaphoretic.  HENT:     Head: Normocephalic and atraumatic.     Right Ear: Ear canal and external ear normal.     Left Ear: Ear canal and external ear normal.     Nose: Nose normal. No congestion or rhinorrhea.     Mouth/Throat:     Mouth: Mucous membranes are moist.     Pharynx: Oropharynx is clear. No oropharyngeal exudate or posterior oropharyngeal erythema.  Eyes:     General: No scleral icterus.    Conjunctiva/sclera: Conjunctivae normal.  Cardiovascular:     Rate and Rhythm: Normal rate and regular rhythm.  Pulses: Normal pulses.     Heart sounds: Normal heart sounds.  Pulmonary:     Effort: Pulmonary effort is normal. No respiratory distress.     Breath sounds: Normal breath sounds. No wheezing.  Abdominal:     General: Bowel sounds are normal.     Palpations: Abdomen is soft. There is no mass.     Tenderness: There is no abdominal tenderness. There is no guarding or rebound.  Musculoskeletal:        General: Normal range of motion.     Cervical back: Normal range of motion and neck supple.  Skin:    General: Skin is warm and dry.  Neurological:     Mental Status: She is alert.  Psychiatric:        Behavior: Behavior normal.    ED Results / Procedures /  Treatments   Labs (all labs ordered are listed, but only abnormal results are displayed) Labs Reviewed  RESP PANEL BY RT-PCR (FLU A&B, COVID) ARPGX2 - Abnormal; Notable for the following components:      Result Value   SARS Coronavirus 2 by RT PCR POSITIVE (*)    All other components within normal limits    EKG None  Radiology No results found.  Procedures Procedures    Medications Ordered in ED Medications - No data to display  ED Course/ Medical Decision Making/ A&P                           Medical Decision Making  Patient with elevated temperature at home (max temp 100) today.  Patient with sore throat, productive cough, postnasal drip.  Patient works at [employer] and notes sick contacts at work.  Vital signs stable, patient afebrile, not hypoxic, no tachycardia.  On exam patient with maxillary sinus tenderness to palpation bilaterally, no acute cardiovascular, pulmonary, abdominal exam findings.  Differential diagnosis includes COVID, flu, viral URI with cough, sinusitis.  Labs:  I ordered, and personally interpreted labs.  The pertinent results include: COVID and flu swab.  Swab positive for COVID.   Disposition: Patient presentation suspicious for COVID-19.  Doubt flu, viral URI with cough, or sinusitis at this time. After consideration of the diagnostic results and the patients response to treatment, I feel that the patent would benefit from discharge home with quarantine until 12/18/2021.  Patient without comorbidities for consideration of Paxlovid at this time. Supportive care measures and strict return precautions discussed with patient at bedside. Pt acknowledges and verbalizes understanding. Pt appears safe for discharge. Follow up as indicated in discharge paperwork.    This chart was dictated using voice recognition software, Dragon. Despite the best efforts of this provider to proofread and correct errors, errors may still occur which can change documentation  meaning.  Final Clinical Impression(s) / ED Diagnoses Final diagnoses:  COVID-19    Rx / DC Orders ED Discharge Orders     None         Lavance Beazer A, PA-C 12/15/21 1806    Virgina Norfolk, DO 12/15/21 1836

## 2024-11-24 ENCOUNTER — Other Ambulatory Visit (HOSPITAL_BASED_OUTPATIENT_CLINIC_OR_DEPARTMENT_OTHER): Payer: Self-pay

## 2024-11-24 ENCOUNTER — Ambulatory Visit (HOSPITAL_BASED_OUTPATIENT_CLINIC_OR_DEPARTMENT_OTHER)
Admission: RE | Admit: 2024-11-24 | Discharge: 2024-11-24 | Disposition: A | Discharge status: Home / Self Care | Source: Ambulatory Visit

## 2024-11-24 ENCOUNTER — Encounter (HOSPITAL_BASED_OUTPATIENT_CLINIC_OR_DEPARTMENT_OTHER): Payer: Self-pay

## 2024-11-24 VITALS — BP 123/78 | HR 86 | Temp 98.9°F | Resp 18

## 2024-11-24 DIAGNOSIS — R509 Fever, unspecified: Secondary | ICD-10-CM

## 2024-11-24 DIAGNOSIS — R051 Acute cough: Secondary | ICD-10-CM | POA: Diagnosis not present

## 2024-11-24 DIAGNOSIS — J101 Influenza due to other identified influenza virus with other respiratory manifestations: Secondary | ICD-10-CM

## 2024-11-24 LAB — POC COVID19/FLU A&B COMBO
Covid Antigen, POC: NEGATIVE
Influenza A Antigen, POC: POSITIVE — AB
Influenza B Antigen, POC: NEGATIVE

## 2024-11-24 MED ORDER — OSELTAMIVIR PHOSPHATE 75 MG PO CAPS
75.0000 mg | ORAL_CAPSULE | Freq: Two times a day (BID) | ORAL | 0 refills | Status: AC
  Filled 2024-11-24: qty 10, 5d supply, fill #0

## 2024-11-24 MED ORDER — PROMETHAZINE-DM 6.25-15 MG/5ML PO SYRP
5.0000 mL | ORAL_SOLUTION | Freq: Four times a day (QID) | ORAL | 0 refills | Status: AC | PRN
  Filled 2024-11-24: qty 80, 4d supply, fill #0
  Filled 2024-11-24: qty 75, 4d supply, fill #0
  Filled 2024-11-24: qty 43, 2d supply, fill #0

## 2024-11-24 NOTE — Discharge Instructions (Addendum)
 Influenza type a with cough and fever: Positive for flu type A.  Negative for flu type B and negative for COVID.  Get plenty of fluids and rest.  Tamiflu  75 mg twice daily for 5 days.  Take the Tamiflu  with food.  Promethazine  DM, 5 mL, every 6 hours if needed for cough.  Work excuse provided.  Follow-up if symptoms do not improve, worsen or new symptoms occur.

## 2024-11-24 NOTE — ED Provider Notes (Signed)
 [facility]    CSN: [id number] Arrival date & time: 11/24/24  1148      History   Chief Complaint Chief Complaint  Patient presents with   Cough   Nasal Congestion   Fever    HPI Sheri Simpson is a 58 y.o. female.   58 year old female who reports onset of symptoms on 11/22/2024.  She has runny nose, cough, congestion and sore throat.  On 11/23/2024 she developed persistent headache and fever.  She was exposed to influenza type a on 11/21/2024.  She has a lot of bodyaches and just generally feels fatigued.   Cough Associated symptoms: fever, headaches, rhinorrhea and sore throat   Associated symptoms: no chest pain, no chills, no ear pain, no rash and no shortness of breath   Fever Associated symptoms: congestion, cough, headaches, rhinorrhea and sore throat   Associated symptoms: no chest pain, no chills, no diarrhea, no dysuria, no ear pain, no nausea, no rash and no vomiting     Past Medical History:  Diagnosis Date   Anxiety    Migraines     Patient Active Problem List   Diagnosis Date Noted   Hashimoto's thyroiditis 03/31/2014    Past Surgical History:  Procedure Laterality Date   CESAREAN SECTION     CHOLECYSTECTOMY      OB History   No obstetric history on file.      Home Medications    Prior to Admission medications  Medication Sig Start Date End Date Taking? Authorizing Provider  acyclovir (ZOVIRAX) 200 MG capsule TAKE 1 CAPSULE(200 MG) BY MOUTH TWICE DAILY 05/19/18  Yes [provider]  cetirizine (ZYRTEC) 10 MG tablet Take 1 tablet by mouth daily. 03/12/23  Yes [provider]  levothyroxine  (SYNTHROID , LEVOTHROID) 25 MCG tablet Take 1 tablet (25 mcg total) by mouth daily before breakfast.   Yes Trixie File, MD  oseltamivir  (TAMIFLU ) 75 MG capsule Take 1 capsule (75 mg total) by mouth every 12 (twelve) hours. 11/24/24  Yes Ival Domino, FNP  promethazine -dextromethorphan (PROMETHAZINE -DM) 6.25-15 MG/5ML  syrup Take 5 mLs by mouth 4 (four) times daily as needed for cough. Do not use and drive - May make drowsy. 11/24/24  Yes Ival Domino, FNP  ACETAMINOPHEN -BUTALBITAL 50-325 MG TABS Take 1 tablet by mouth as needed.    [provider]  buPROPion (WELLBUTRIN SR) 150 MG 12 hr tablet Take 150 mg by mouth 2 (two) times daily.    [provider]  butalbital-acetaminophen -caffeine (FIORICET) 50-325-40 MG tablet Take by mouth. 12/02/18   [provider]  HYDROcodone -acetaminophen  (NORCO/VICODIN) 5-325 MG tablet Take 2 tablets by mouth every 4 (four) hours as needed. 10/06/19   Landy Honora CROME, PA-C  moxifloxacin (AVELOX) 400 MG tablet Take 400 mg by mouth daily.    [provider]  promethazine  (PHENERGAN ) 25 MG tablet Take 25 mg by mouth as needed for nausea or vomiting.    [provider]    Family History Family History  Problem Relation Age of Onset   Stroke Mother    Hypertension Mother    Thyroid  disease Paternal Grandmother     Social History Social History[1]   Allergies   Levothyroxine , Erythromycin, Prednisone, and Acetaminophen    Review of Systems Review of Systems  Constitutional:  Positive for fatigue and fever. Negative for chills.  HENT:  Positive for congestion, postnasal drip, rhinorrhea and sore throat. Negative for ear pain.   Eyes:  Negative for pain and visual disturbance.  Respiratory:  Positive for cough. Negative for shortness of breath.   Cardiovascular:  Negative for chest pain and palpitations.  Gastrointestinal:  Negative for abdominal pain, constipation, diarrhea, nausea and vomiting.  Genitourinary:  Negative for dysuria and hematuria.  Musculoskeletal:  Positive for arthralgias. Negative for back pain.  Skin:  Negative for color change and rash.  Neurological:  Positive for headaches. Negative for seizures and syncope.  All other systems reviewed and are negative.    Physical Exam Triage Vital Signs ED  Triage Vitals  Encounter Vitals Group     BP 11/24/24 1206 123/78     Girls Systolic BP Percentile --      Girls Diastolic BP Percentile --      Boys Systolic BP Percentile --      Boys Diastolic BP Percentile --      Pulse Rate 11/24/24 1206 86     Resp 11/24/24 1206 18     Temp 11/24/24 1206 98.9 F (37.2 C)     Temp Source 11/24/24 1206 Oral     SpO2 11/24/24 1206 96 %     Weight --      Height --      Head Circumference --      Peak Flow --      Pain Score 11/24/24 1205 0     Pain Loc --      Pain Education --      Exclude from Growth Chart --    No data found.  Updated Vital Signs BP 123/78 (BP Location: Right Arm)   Pulse 86   Temp 98.9 F (37.2 C) (Oral)   Resp 18   SpO2 96%   Visual Acuity Right Eye Distance:   Left Eye Distance:   Bilateral Distance:    Right Eye Near:   Left Eye Near:    Bilateral Near:     Physical Exam Vitals and nursing note reviewed.  Constitutional:      General: She is not in acute distress.    Appearance: She is well-developed. She is not ill-appearing or toxic-appearing.  HENT:     Head: Normocephalic and atraumatic.     Right Ear: Hearing, tympanic membrane, ear canal and external ear normal.     Left Ear: Hearing, tympanic membrane, ear canal and external ear normal.     Nose: Congestion and rhinorrhea present. Rhinorrhea is clear.     Right Sinus: No maxillary sinus tenderness or frontal sinus tenderness.     Left Sinus: No maxillary sinus tenderness or frontal sinus tenderness.     Mouth/Throat:     Lips: Pink.     Mouth: Mucous membranes are moist.     Pharynx: Uvula midline. No oropharyngeal exudate or posterior oropharyngeal erythema.     Tonsils: No tonsillar exudate.  Eyes:     Conjunctiva/sclera: Conjunctivae normal.     Pupils: Pupils are equal, round, and reactive to light.  Cardiovascular:     Rate and Rhythm: Normal rate and regular rhythm.     Heart sounds: S1 normal and S2 normal. No murmur  heard. Pulmonary:     Effort: Pulmonary effort is normal. No respiratory distress.     Breath sounds: Normal breath sounds. No decreased breath sounds, wheezing, rhonchi or rales.  Abdominal:     General: Bowel sounds are normal.     Palpations: Abdomen is soft.     Tenderness: There is no abdominal tenderness.  Musculoskeletal:        General: No swelling.  Cervical back: Neck supple.  Lymphadenopathy:     Head:     Right side of head: No submental, submandibular, tonsillar, preauricular or posterior auricular adenopathy.     Left side of head: No submental, submandibular, tonsillar, preauricular or posterior auricular adenopathy.     Cervical: Cervical adenopathy present.     Right cervical: Superficial cervical adenopathy present.     Left cervical: Superficial cervical adenopathy present.  Skin:    General: Skin is warm and dry.     Capillary Refill: Capillary refill takes less than 2 seconds.     Findings: No rash.  Neurological:     Mental Status: She is alert and oriented to person, place, and time.  Psychiatric:        Mood and Affect: Mood normal.      UC Treatments / Results  Labs (all labs ordered are listed, but only abnormal results are displayed) Labs Reviewed  POC COVID19/FLU A&B COMBO - Abnormal; Notable for the following components:      Result Value   Influenza A Antigen, POC Positive (*)    All other components within normal limits    EKG   Radiology No results found.  Procedures Procedures (including critical care time)  Medications Ordered in UC Medications - No data to display  Initial Impression / Assessment and Plan / UC Course  I have reviewed the triage vital signs and the nursing notes.  Pertinent labs & imaging results that were available during my care of the patient were reviewed by me and considered in my medical decision making (see chart for details).  Plan of Care (see discharge instructions for additional patient  precautions and education): Influenza type a with cough and fever: Positive for flu type A.  Negative for flu type B and negative for COVID.  Get plenty of fluids and rest.  Tamiflu  75 mg twice daily for 5 days.  Take the Tamiflu  with food.  Promethazine  DM, 5 mL, every 6 hours if needed for cough.  Work excuse provided.  Follow-up if symptoms do not improve, worsen or new symptoms occur.  I reviewed the plan of care with the patient and/or the patient's guardian.  The patient and/or guardian had time to ask questions and acknowledged that the questions were answered.  Final Clinical Impressions(s) / UC Diagnoses   Final diagnoses:  Type A influenza  Acute cough  Fever, unspecified     Discharge Instructions      Influenza type a with cough and fever: Positive for flu type A.  Negative for flu type B and negative for COVID.  Get plenty of fluids and rest.  Tamiflu  75 mg twice daily for 5 days.  Take the Tamiflu  with food.  Promethazine  DM, 5 mL, every 6 hours if needed for cough.  Work excuse provided.  Follow-up if symptoms do not improve, worsen or new symptoms occur.     ED Prescriptions     Medication Sig Dispense Auth. Provider   oseltamivir  (TAMIFLU ) 75 MG capsule Take 1 capsule (75 mg total) by mouth every 12 (twelve) hours. 10 capsule Ival Domino, FNP   promethazine -dextromethorphan (PROMETHAZINE -DM) 6.25-15 MG/5ML syrup Take 5 mLs by mouth 4 (four) times daily as needed for cough. Do not use and drive - May make drowsy. 118 mL Ival Domino, FNP      PDMP not reviewed this encounter.    [1]  Social History Tobacco Use   Smoking status: Never   Smokeless tobacco: Never  Substance Use Topics   Alcohol use: No   Drug use: No     Ival Domino, FNP 11/24/24 1228

## 2024-11-24 NOTE — ED Triage Notes (Signed)
 Pt reports she was around her friend on Monday night and she had nasal congestion and was told on Tuesday night that the friend test positive for Flu A. Pt symptoms started yesterday morning with coughing, nasal congestion, fever, sore throat.

## 2024-12-28 ENCOUNTER — Other Ambulatory Visit: Payer: Self-pay
